# Patient Record
Sex: Male | Born: 1995 | Hispanic: Refuse to answer | Marital: Single | Smoking: Never smoker
Health system: Southern US, Community
[De-identification: ages and names within clinical notes are randomized; demographics above are authoritative.]

---
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
## 2019-06-20 ENCOUNTER — Ambulatory Visit: Payer: 59 | Attending: Internal Medicine

## 2019-06-20 DIAGNOSIS — Z23 Encounter for immunization: Secondary | ICD-10-CM

## 2019-06-20 NOTE — Progress Notes (Signed)
   Covid-19 Vaccination Clinic  Name:  Kenneth Huynh    MRN: [id number] DOB: 1995/08/03  06/20/2019  Kenneth Huynh was observed post Covid-19 immunization for 15 minutes without incident. He was provided with Vaccine Information Sheet and instruction to access the V-Safe system.   Kenneth Huynh was instructed to call 911 with any severe reactions post vaccine: Marland Kitchen Difficulty breathing  . Swelling of face and throat  . A fast heartbeat  . A bad rash all over body  . Dizziness and weakness   Immunizations Administered    Name Date Dose VIS Date Route   Pfizer COVID-19 Vaccine 06/20/2019 12:48 PM 0.3 mL 02/24/2019 Intramuscular   Manufacturer: [employer], [employer]   Lot: [id number]   NDC: [address]-4
